# Patient Record
Sex: Female | Born: 1965 | Race: White | Hispanic: No | Marital: Married | Smoking: Never smoker
Health system: Southern US, Community
[De-identification: ages and names within clinical notes are randomized; demographics above are authoritative.]

## PROBLEM LIST (undated history)

## (undated) DIAGNOSIS — N1330 Unspecified hydronephrosis: Secondary | ICD-10-CM

## (undated) DIAGNOSIS — R109 Unspecified abdominal pain: Secondary | ICD-10-CM

## (undated) DIAGNOSIS — K219 Gastro-esophageal reflux disease without esophagitis: Secondary | ICD-10-CM

## (undated) DIAGNOSIS — E509 Vitamin A deficiency, unspecified: Secondary | ICD-10-CM

## (undated) DIAGNOSIS — Z6820 Body mass index (BMI) 20.0-20.9, adult: Secondary | ICD-10-CM

## (undated) DIAGNOSIS — F331 Major depressive disorder, recurrent, moderate: Secondary | ICD-10-CM

## (undated) DIAGNOSIS — F419 Anxiety disorder, unspecified: Secondary | ICD-10-CM

## (undated) DIAGNOSIS — I10 Essential (primary) hypertension: Secondary | ICD-10-CM

## (undated) HISTORY — DX: Essential (primary) hypertension: I10

## (undated) HISTORY — DX: Unspecified hydronephrosis: N13.30

## (undated) HISTORY — DX: Anxiety disorder, unspecified: F41.9

## (undated) HISTORY — PX: APPENDECTOMY: SHX54

## (undated) HISTORY — PX: HERNIA REPAIR: SHX51

## (undated) HISTORY — PX: KIDNEY STONE SURGERY: SHX686

## (undated) HISTORY — DX: Major depressive disorder, recurrent, moderate: F33.1

## (undated) HISTORY — DX: Gastro-esophageal reflux disease without esophagitis: K21.9

## (undated) HISTORY — DX: Unspecified abdominal pain: R10.9

## (undated) HISTORY — PX: CHOLECYSTECTOMY: SHX55

## (undated) HISTORY — DX: Body mass index (BMI) 20.0-20.9, adult: Z68.20

## (undated) HISTORY — DX: Vitamin a deficiency, unspecified: E50.9

---

## 2005-01-14 ENCOUNTER — Inpatient Hospital Stay (HOSPITAL_COMMUNITY): Admission: AD | Admit: 2005-01-14 | Discharge: 2005-01-14 | Payer: Self-pay | Admitting: Obstetrics and Gynecology

## 2005-01-15 ENCOUNTER — Inpatient Hospital Stay (HOSPITAL_COMMUNITY): Admission: AD | Admit: 2005-01-15 | Discharge: 2005-01-15 | Payer: Self-pay | Admitting: Obstetrics and Gynecology

## 2005-01-16 ENCOUNTER — Inpatient Hospital Stay (HOSPITAL_COMMUNITY): Admission: AD | Admit: 2005-01-16 | Discharge: 2005-01-16 | Payer: Self-pay | Admitting: Obstetrics and Gynecology

## 2005-01-23 ENCOUNTER — Inpatient Hospital Stay (HOSPITAL_COMMUNITY): Admission: AD | Admit: 2005-01-23 | Discharge: 2005-01-23 | Payer: Self-pay | Admitting: Obstetrics and Gynecology

## 2005-02-09 ENCOUNTER — Ambulatory Visit: Payer: Self-pay | Admitting: Family Medicine

## 2006-03-08 IMAGING — US US OB COMP LESS 14 WK
1 series · 14 of 28 positions shown · non-contrast
Comparison: none

CLINICAL DATA: Pregnant with pain, some bleeding.   

 OBSTETRICAL ULTRASOUND:
 There is in evidence of intrauterine gestational sac.   The endometrium is thickened and heterogeneous measuring 30.1 mm.  No yolk sac or embryo is visible.   Ovaries are normal in size with a small cyst noted on the right of 2.2 x 2.3 cm.

[Series 1: us ob comp less 14 wk · 0.31mm/px · 14 of 60 slices shown]
[im 3/60]
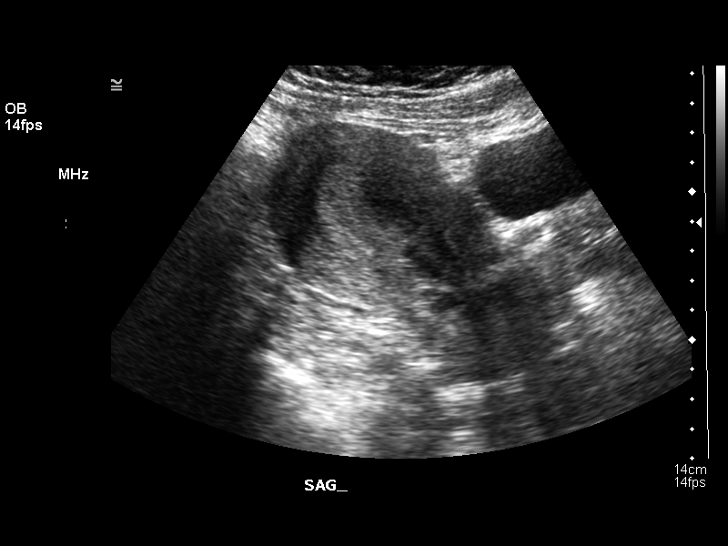
[im 7/60]
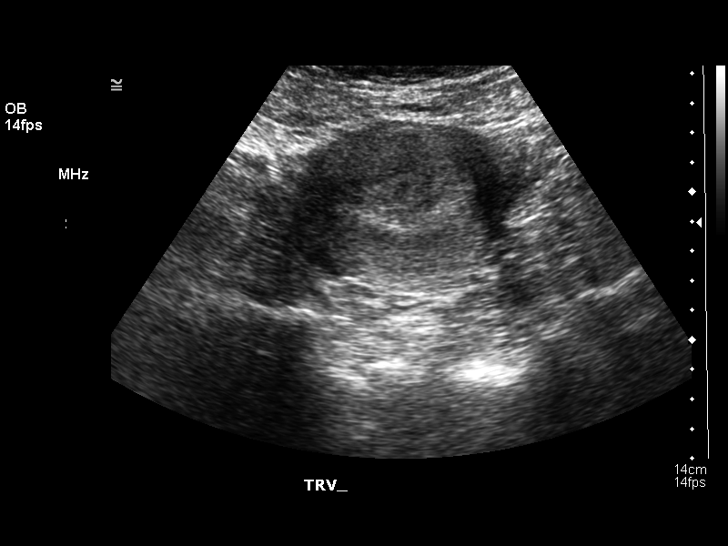
[im 11/60]
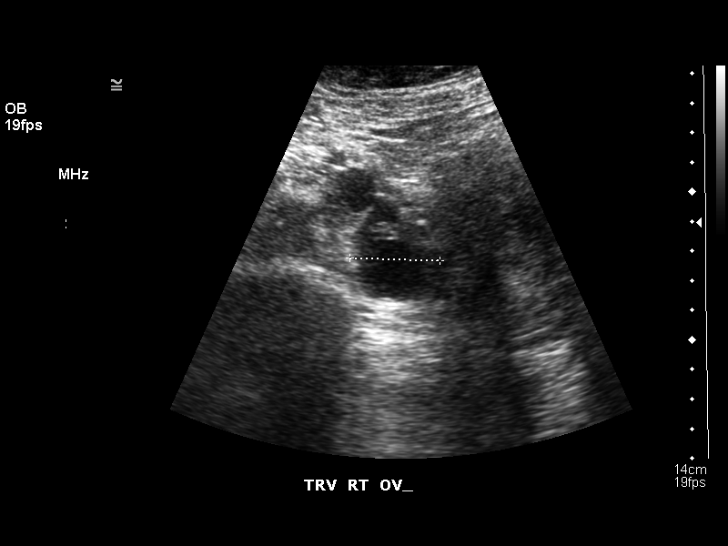
[im 16/60]
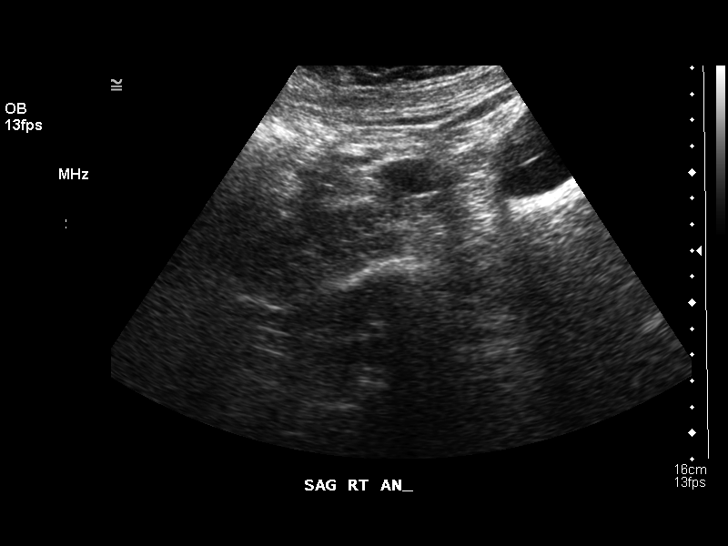
[im 20/60]
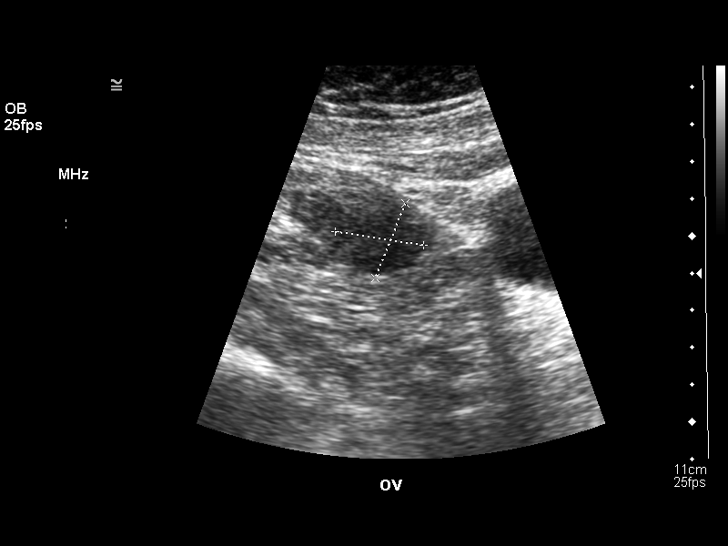
[im 25/60]
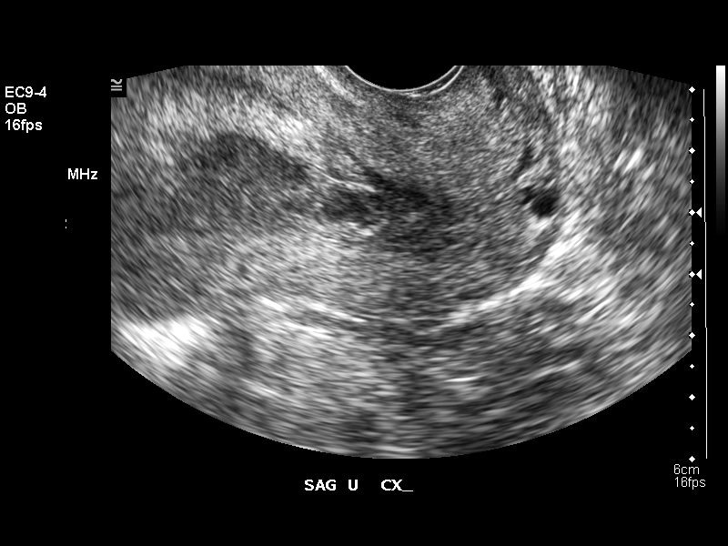
[im 29/60]
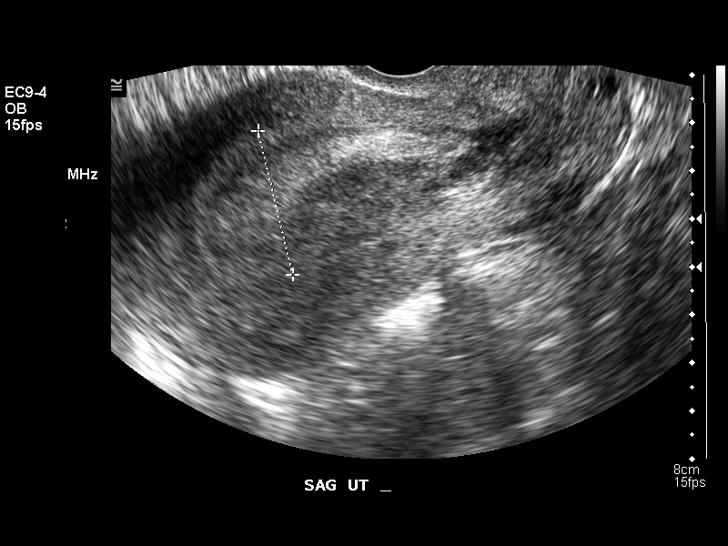
[im 33/60]
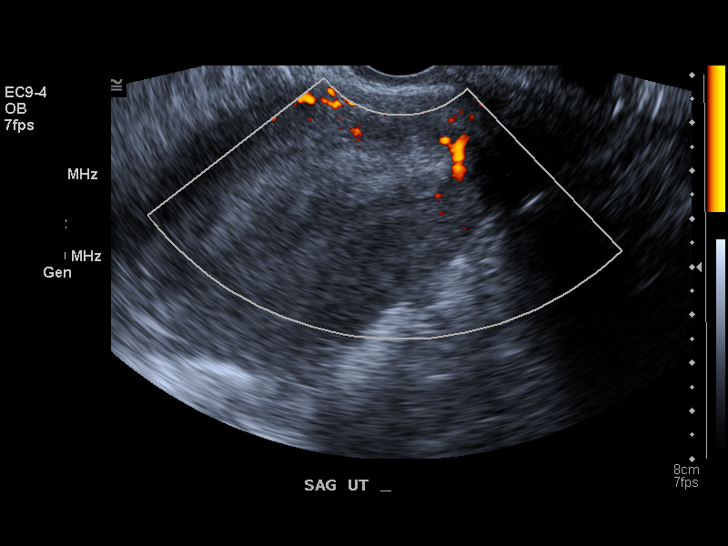
[im 38/60]
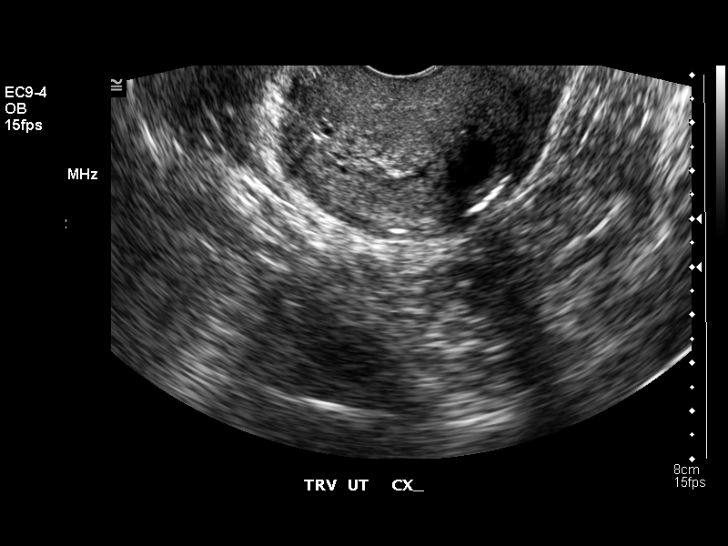
[im 42/60]
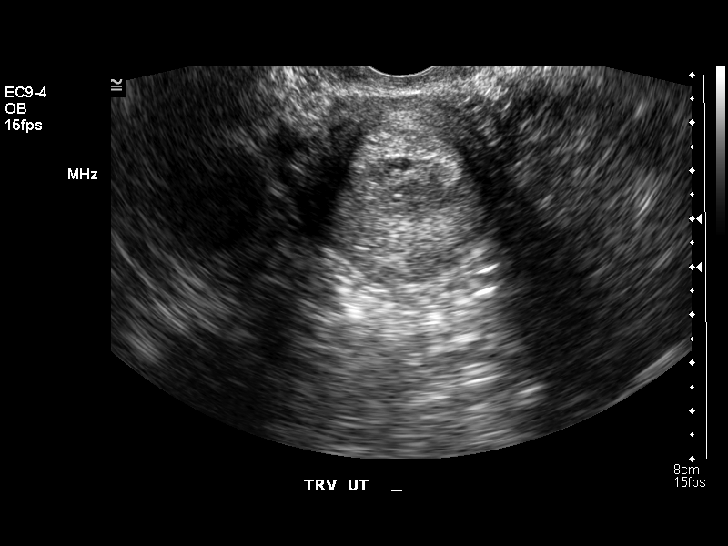
[im 46/60]
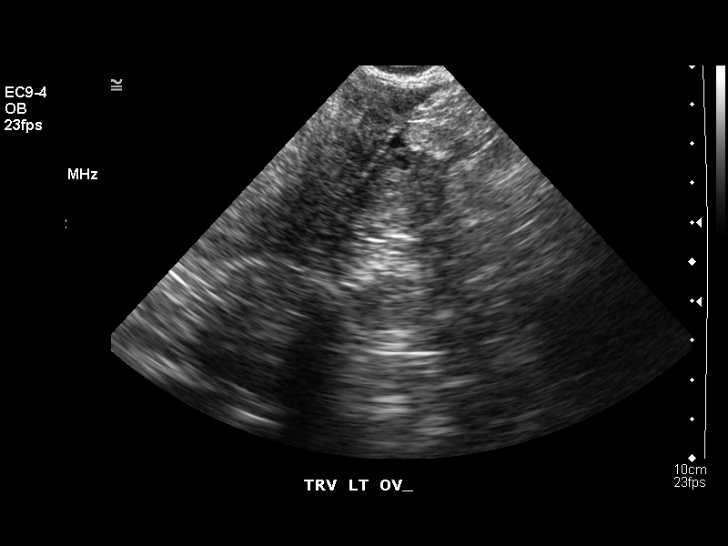
[im 51/60]
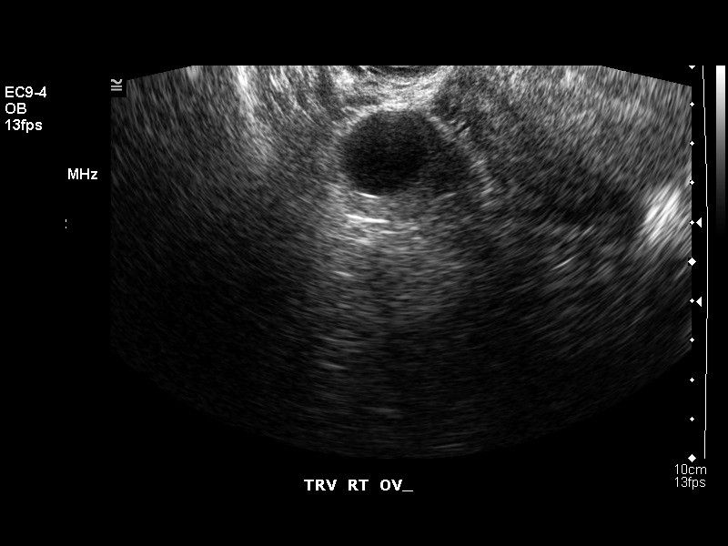
[im 55/60]
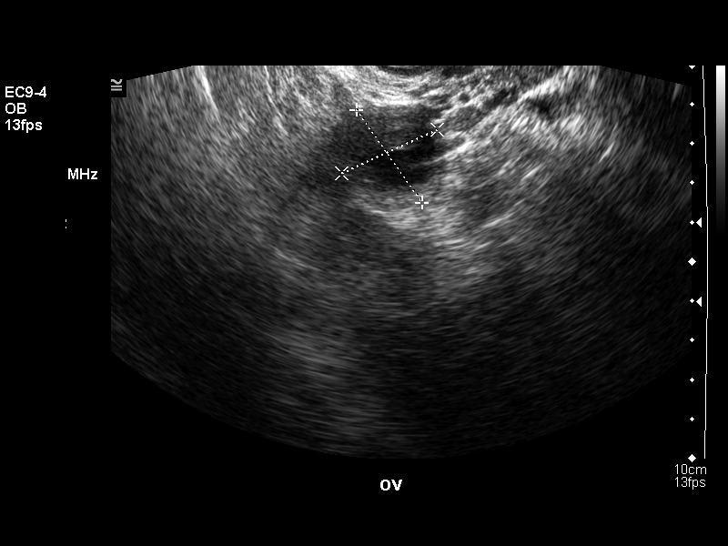
[im 60/60]
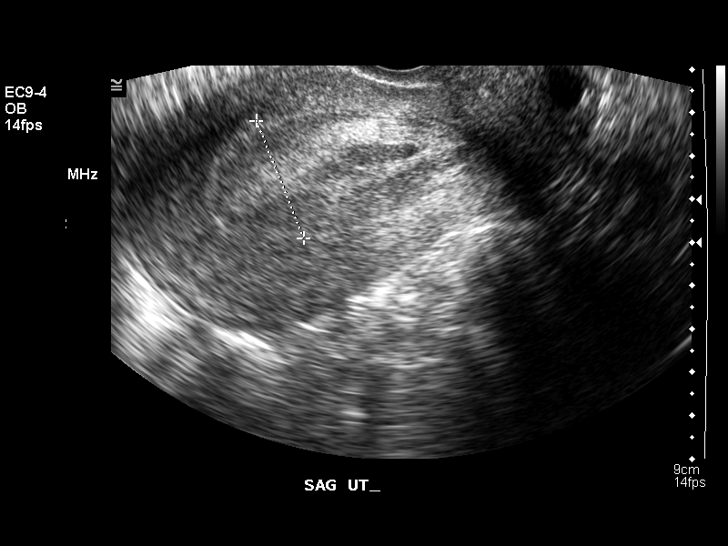

[14 of 28 positions shown; findings below may reference images not displayed]

IMPRESSION: No IUP.  Endometrium is thickened and heterogeneous measuring 30.8 mm.

## 2007-03-23 ENCOUNTER — Encounter: Admission: RE | Admit: 2007-03-23 | Discharge: 2007-03-23 | Payer: Self-pay | Admitting: Internal Medicine

## 2015-10-08 DIAGNOSIS — N200 Calculus of kidney: Secondary | ICD-10-CM | POA: Insufficient documentation

## 2015-10-17 DIAGNOSIS — Z9889 Other specified postprocedural states: Secondary | ICD-10-CM

## 2015-10-17 DIAGNOSIS — Z87442 Personal history of urinary calculi: Secondary | ICD-10-CM | POA: Insufficient documentation

## 2015-10-18 DIAGNOSIS — L253 Unspecified contact dermatitis due to other chemical products: Secondary | ICD-10-CM | POA: Insufficient documentation

## 2015-10-19 DIAGNOSIS — I10 Essential (primary) hypertension: Secondary | ICD-10-CM | POA: Insufficient documentation

## 2015-11-24 DIAGNOSIS — N135 Crossing vessel and stricture of ureter without hydronephrosis: Secondary | ICD-10-CM | POA: Insufficient documentation

## 2017-06-08 ENCOUNTER — Institutional Professional Consult (permissible substitution): Payer: Self-pay | Admitting: Pulmonary Disease

## 2021-04-01 ENCOUNTER — Encounter: Payer: Self-pay | Admitting: Gastroenterology

## 2021-04-15 ENCOUNTER — Ambulatory Visit (INDEPENDENT_AMBULATORY_CARE_PROVIDER_SITE_OTHER): Payer: Self-pay | Admitting: Gastroenterology

## 2021-04-15 ENCOUNTER — Other Ambulatory Visit: Payer: Self-pay

## 2021-04-15 VITALS — BP 107/70 | HR 76 | Ht 68.0 in | Wt 175.0 lb

## 2021-04-15 DIAGNOSIS — K5900 Constipation, unspecified: Secondary | ICD-10-CM

## 2021-04-15 DIAGNOSIS — R933 Abnormal findings on diagnostic imaging of other parts of digestive tract: Secondary | ICD-10-CM

## 2021-04-15 DIAGNOSIS — R1030 Lower abdominal pain, unspecified: Secondary | ICD-10-CM

## 2021-04-15 MED ORDER — CLENPIQ 10-3.5-12 MG-GM -GM/160ML PO SOLN: 1.0000 | ORAL | 0 refills | Status: DC

## 2021-04-15 NOTE — Patient Instructions (Signed)
If you are age 55 or older, your body mass index should be between 23-30. Your Body mass index is 26.61 kg/m. If this is out of the aforementioned range listed, please consider follow up with your Primary Care Provider.  If you are age 107 or younger, your body mass index should be between 19-25. Your Body mass index is 26.61 kg/m. If this is out of the aformentioned range listed, please consider follow up with your Primary Care Provider.   __________________________________________________________  The [facility] providers would like to encourage you to use [facility] to communicate with providers for non-urgent requests or questions.  Due to long hold times on the telephone, sending your provider a message by [facility] may be a faster and more efficient way to get a response.  Please allow 48 business hours for a response.  Please remember that this is for non-urgent requests.   __________________________________________________________ Dennis Bast have been given a packet of Sitz markers. Please follow the instructions and have an abdominal xray at [facility] on the 1st floor.  We will contact you once the xray has been read to advise you if we will proceed with the colononspopy.  We have sent the following medications to your pharmacy for you to pick up at your convenience:  Clenpiq- for your colonoscopy- do not pick this up until you have been directed about the colonoscopy  Due to recent changes in healthcare laws, you may see the results of your imaging and laboratory studies on MyChart before your provider has had a chance to review them.  We understand that in some cases there may be results that are confusing or concerning to you. Not all laboratory results come back in the same time frame and the provider may be waiting for multiple results in order to interpret others.  Please give [place] 48 hours in order for your provider to thoroughly review all the results before contacting the  office for clarification of your results.   Thank you for choosing me and [facility].  Vito Cirigliano, D.O.

## 2021-04-15 NOTE — Progress Notes (Signed)
Chief Complaint: Change in bowel habits, constipation   Referring Provider:     Renaldo Reel, PA   HPI:     Amber Cohen is a 55 y.o. female with history of HTN, depression, anxiety, nephrolithiasis s/p lithotripsy, cholecystectomy 2019, referred to the Gastroenterology Clinic for evaluation of alternating bowel habits.    History of constipation for the last 4 years or so, which she reports started shortly after her lithotripsy.  Does have episodic watery, nonbloody stools, but constipation is the main issue.  Does have some associated abdominal bloating and nausea when the constipation exacerbates.  Has trialed multiple agents/interventions, to include Miralax, Mag citrate, high fiber diet, increased water intake, prn enemas, occasional manual disimpaction.   More recently used Dulcolax with improvement, but diarrhea/urgency, so she stopped.  -03/25/2021: CT abdomen/pelvis at [facility]: 7 mm hepatic and angioma, otherwise normal liver and no duct dilation.  Small HH diffuse colonic wall thickening with mild adjacent inflammation extending from hepatic flexure to rectum, consistent with nonspecific infectious or inflammatory colitis. - 03/27/2021: Seen in follow-up by her PCM and started on Cipro/Flagyl (then Diflucan for yeast infection)   Currenlty, high fiber diet, hydration ; no longer using Miralax or other laxatives/stimulants.    Also with a "sore spot" on left flank for last couple years. Worse with exertion and BM.  Not present currently.  Reports having colonoscopy about 4 years ago by Dr. Lyda Jester which was normal per patient.  No report available for review.    Was seen in the ER on 03/21/2021 for evaluation of fatigue x1 month.  Did report constipation at that time as well, with periodic use of enema.  Evaluation notable for the following: - Normal CBC, UA, TSH - Mild hypomagnesemia (1.6) with otherwise normal BMP - Normal ECG - Treated with IV  fluids and IV magnesium with improvement and discharged home.  No imaging studies for review   Past Medical History:  Diagnosis Date   Acute right flank pain    Bilateral hydronephrosis    BMI 20.0-20.9, adult    Essential hypertension    Moderate episode of recurrent major depressive disorder (HCC)    Vitamin A deficiency      Past Surgical History:  Procedure Laterality Date   APPENDECTOMY     CHOLECYSTECTOMY     HERNIA REPAIR     KIDNEY STONE SURGERY     Family History  Problem Relation Age of Onset   Stroke Mother    Colon cancer Neg Hx      Current Outpatient Medications  Medication Sig Dispense Refill   amLODipine (NORVASC) 5 MG tablet Take 5 mg by mouth.     budesonide-formoterol (SYMBICORT) 160-4.5 MCG/ACT inhaler Inhale 2 puffs into the lungs 2 (two) times daily.     busPIRone (BUSPAR) 5 MG tablet TK 1 TO 2 TS PO BID FOR ANXIETY     FLUoxetine HCl 60 MG TABS      indapamide (LOZOL) 2.5 MG tablet Take 2.5 mg by mouth.     Lactobacillus Rhamnosus, GG, (RA PROBIOTIC DIGESTIVE CARE) CAPS Take 1 capsule by mouth daily.     levonorgestrel-ethinyl estradiol (NORDETTE) 0.15-30 MG-MCG tablet Take 0.15 mg by mouth.     mometasone (NASONEX) 50 MCG/ACT nasal spray      Multiple Vitamin (MULTI-VITAMINS) TABS Take by mouth.     Ondansetron 4 MG FILM Take by mouth.  potassium chloride (KLOR-CON) 20 MEQ packet Take by mouth 2 (two) times daily.     Potassium Citrate 15 MEQ (1620 MG) TBCR take 1 tablet by mouth twice a day     solifenacin (VESICARE) 10 MG tablet take 1 tablet by mouth once daily if needed for SPASM     No current facility-administered medications for this visit.   Allergies  Allergen Reactions   Ciprofloxacin Hives    Patient states she is not allergic to Cipro. Was given other medication during hospitalization.   Latex Rash    Skin Blister     Review of Systems: All systems reviewed and negative except where noted in HPI.     Physical Exam:     Wt Readings from Last 3 Encounters:  04/15/21 175 lb (79.4 kg)    BP 107/70   Pulse 76   Ht 5\' 8"  (1.727 m)   Wt 175 lb (79.4 kg)   SpO2 98%   BMI 26.61 kg/m  Constitutional:  Pleasant, in no acute distress. Psychiatric: Normal mood and affect. Behavior is normal. Cardiovascular: Normal rate, regular rhythm. No edema Pulmonary/chest: Effort normal and breath sounds normal. No wheezing, rales or rhonchi. Abdominal: Minimal TTP in lower abdomen without rebound or guarding.  No peritoneal signs.  Soft, nondistended, nontender. Bowel sounds active throughout. There are no masses palpable. No hepatomegaly. Neurological: Alert and oriented to person place and time. Skin: Skin is warm and dry. No rashes noted.   ASSESSMENT AND PLAN;   1) Constipation - Chronic constipation with episodic overflow diarrhea - Start with sitz marker study.  Symptoms otherwise seem less consistent with pelvic floor dyssynergia, so holding off on ARM for now (although the abrupt onset of symptoms is somewhat suspicious) - Do not restart laxatives, stool softeners, etc. until completion of sitz marker study - Continue adequate hydration - Continue high-fiber diet with fiber supplement as needed - Pending results above, briefly discussed starting Linzess, Amitiza, Motegrity, etc.  2) Colitis on CT - Discussed the role of colonoscopy to evaluate for mucosal healing and rule out luminal obstruction.  Scheduled to be done in late November, allowing 6-8 weeks of healing time  3) Lower abdominal pain - 2/2 the above constipation, but plan to rule out concomitant fecal/luminal pathology at time of colonoscopy as above, particular with colitis seen on CT  The indications, risks, and benefits of colonoscopy were explained to the patient in detail. Risks include but are not limited to bleeding, perforation, adverse reaction to medications, and cardiopulmonary compromise. Sequelae include but are not limited to the  possibility of surgery, hospitalization, and mortality. The patient verbalized understanding and wished to proceed. All questions answered, referred to the scheduler and bowel prep ordered. Further recommendations pending results of the exam.      Lavena Bullion, DO, FACG  04/15/2021, 8:52 AM   Renaldo Reel, PA

## 2021-04-19 ENCOUNTER — Ambulatory Visit (HOSPITAL_BASED_OUTPATIENT_CLINIC_OR_DEPARTMENT_OTHER)
Admission: RE | Admit: 2021-04-19 | Discharge: 2021-04-19 | Disposition: A | Discharge status: Home / Self Care | Payer: BC Managed Care – PPO | Source: Ambulatory Visit | Attending: Gastroenterology | Admitting: Gastroenterology

## 2021-04-19 ENCOUNTER — Other Ambulatory Visit: Payer: Self-pay

## 2021-04-19 DIAGNOSIS — K5900 Constipation, unspecified: Secondary | ICD-10-CM | POA: Insufficient documentation

## 2021-04-19 DIAGNOSIS — R1030 Lower abdominal pain, unspecified: Secondary | ICD-10-CM | POA: Insufficient documentation

## 2021-04-22 ENCOUNTER — Telehealth: Payer: Self-pay | Admitting: General Surgery

## 2021-04-22 NOTE — Telephone Encounter (Signed)
-----   Message from Lavena Bullion, DO sent at 04/22/2021 12:48 PM EDT ----- Abdominal x-ray reviewed.  1 radiopaque marker in the LUQ and an additional 8 markers in the pelvis.  While this is not a technically positive study based on the number of retained markers (9), the fact that the majority are retained within the pelvis is suggestive of distal pathology such as pelvic floor dyssynergia.  Aside from large colonic stool burden, no other intra-abdominal pathology noted on imaging study.  Plan for the following: - Proceed with colonoscopy as scheduled for further evaluation along with evaluation of colitis seen on previous CT -Anorectal Manometry to be done after completion of colonoscopy -Can start trial of Linzess 72 mcg/day

## 2021-04-22 NOTE — Telephone Encounter (Signed)
Contacted the patient and discussed the results to her xray. The patient verbalized understanding and will have her scheduled colonoscopy. Samples of Linzess were offered to the patient she will pick up 4 bottles 39mcg . Patient advised to take 72 mcg daily. She will contact the office after she has taken them to advise if they are working.  Linzess 72 mcg samples 4 bottles- 4 pills in each Lot #[id number] exp 05/2022

## 2021-05-21 ENCOUNTER — Other Ambulatory Visit: Payer: Self-pay | Admitting: General Surgery

## 2021-05-21 ENCOUNTER — Telehealth: Payer: Self-pay | Admitting: General Surgery

## 2021-05-21 NOTE — Telephone Encounter (Signed)
The patient contacted the office and stated that for some reason her colonoscopy date was moved to Dec 8 instead of being on 05/26/2021 at 11:00. I was unable to figure out the reason for this. Moved her appointment back to 11/23 at 1230 the patient was already off work for that day. Patient was understanding and happy by the end of our conversation. Patients father came to clinic to pick up new instructions.

## 2021-05-25 ENCOUNTER — Telehealth: Payer: Self-pay | Admitting: General Surgery

## 2021-05-25 NOTE — Telephone Encounter (Signed)
Patient moved to 12:00pm detailed message left.

## 2021-05-25 NOTE — Telephone Encounter (Signed)
LVM for the patient to move her [place] appointment up for 1122/2022 from 1230 to 9am

## 2021-05-25 NOTE — Telephone Encounter (Signed)
Tried to contact the patients husband,no voicemail set up

## 2021-05-26 ENCOUNTER — Encounter: Payer: Self-pay | Admitting: Gastroenterology

## 2021-05-26 ENCOUNTER — Encounter: Payer: BC Managed Care – PPO | Admitting: Gastroenterology

## 2021-05-26 ENCOUNTER — Ambulatory Visit (AMBULATORY_SURGERY_CENTER): Payer: BC Managed Care – PPO | Admitting: Gastroenterology

## 2021-05-26 ENCOUNTER — Other Ambulatory Visit: Payer: Self-pay | Admitting: Gastroenterology

## 2021-05-26 VITALS — BP 150/77 | HR 65 | Temp 98.6°F | Resp 17 | Ht 68.0 in | Wt 175.0 lb

## 2021-05-26 DIAGNOSIS — K640 First degree hemorrhoids: Secondary | ICD-10-CM | POA: Diagnosis not present

## 2021-05-26 DIAGNOSIS — K5730 Diverticulosis of large intestine without perforation or abscess without bleeding: Secondary | ICD-10-CM

## 2021-05-26 DIAGNOSIS — R933 Abnormal findings on diagnostic imaging of other parts of digestive tract: Secondary | ICD-10-CM | POA: Diagnosis not present

## 2021-05-26 DIAGNOSIS — K635 Polyp of colon: Secondary | ICD-10-CM

## 2021-05-26 DIAGNOSIS — K5900 Constipation, unspecified: Secondary | ICD-10-CM | POA: Diagnosis not present

## 2021-05-26 DIAGNOSIS — R1030 Lower abdominal pain, unspecified: Secondary | ICD-10-CM | POA: Diagnosis not present

## 2021-05-26 DIAGNOSIS — D123 Benign neoplasm of transverse colon: Secondary | ICD-10-CM

## 2021-05-26 MED ORDER — SODIUM CHLORIDE 0.9 % IV SOLN: 500.0000 mL | Freq: Once | INTRAVENOUS | Status: DC

## 2021-05-26 NOTE — Progress Notes (Signed)
DT vitals and  EW IV.

## 2021-05-26 NOTE — Progress Notes (Signed)
GASTROENTEROLOGY PROCEDURE H&P NOTE   Primary Care Physician: Pcp, No    Reason for Procedure:  Change in bowel habits, constipation with episodic diarrhea, abdominal bloating, abnormal CT  Plan:    Colonoscopy  Patient is appropriate for endoscopic procedure(s) in the ambulatory ([place]) setting.  The nature of the procedure, as well as the risks, benefits, and alternatives were carefully and thoroughly reviewed with the patient. Ample time for discussion and questions allowed. The patient understood, was satisfied, and agreed to proceed.     HPI: Amber Cohen is a 55 y.o. female who presents for colonoscopy for evaluation of change in bowel habits with predominant constipation and episodic watery, nonbloody stools.  CT in 03/2021 with diffuse colonic wall thickening with mild adjacent inflammation extending from hepatic flexure to rectum, consistent with nonspecific infectious or inflammatory colitis.  Was treated with course of Cipro/Flagyl by PCM.  Subsequent sitz marker study on 04/19/2021 which was notable for 9 retained markers, with 8 in the pelvis.  While this is not a positive study, it does lend some suggestion of delayed outlet and was referred for ARM and started on Linzess.  She presents today for colonoscopy.  Past Medical History:  Diagnosis Date   Acute right flank pain    Anxiety    Bilateral hydronephrosis    BMI 20.0-20.9, adult    Essential hypertension    GERD (gastroesophageal reflux disease)    Moderate episode of recurrent major depressive disorder (HCC)    Vitamin A deficiency     Past Surgical History:  Procedure Laterality Date   APPENDECTOMY     CHOLECYSTECTOMY     HERNIA REPAIR     KIDNEY STONE SURGERY      Prior to Admission medications   Medication Sig Start Date End Date Taking? Authorizing Provider  budesonide-formoterol (SYMBICORT) 160-4.5 MCG/ACT inhaler Inhale 2 puffs into the lungs 2 (two) times daily.   Yes [provider]  busPIRone (BUSPAR) 5 MG tablet TK 1 TO 2 TS PO BID FOR ANXIETY 05/28/18  Yes [provider]  FLUoxetine HCl 60 MG TABS  12/31/15  Yes [provider]  Multiple Vitamin (MULTI-VITAMINS) TABS Take by mouth.   Yes [provider]  Potassium Citrate 15 MEQ (1620 MG) TBCR take 1 tablet by mouth twice a day 03/09/16  Yes [provider]  Lactobacillus Rhamnosus, GG, (RA PROBIOTIC DIGESTIVE CARE) CAPS Take 1 capsule by mouth daily.    [provider]  levonorgestrel-ethinyl estradiol (NORDETTE) 0.15-30 MG-MCG tablet Take 0.15 mg by mouth. 10/07/15   [provider]  solifenacin (VESICARE) 10 MG tablet take 1 tablet by mouth once daily if needed for SPASM 11/06/15   [provider]    Current Outpatient Medications  Medication Sig Dispense Refill   budesonide-formoterol (SYMBICORT) 160-4.5 MCG/ACT inhaler Inhale 2 puffs into the lungs 2 (two) times daily.     busPIRone (BUSPAR) 5 MG tablet TK 1 TO 2 TS PO BID FOR ANXIETY     FLUoxetine HCl 60 MG TABS      Multiple Vitamin (MULTI-VITAMINS) TABS Take by mouth.     Potassium Citrate 15 MEQ (1620 MG) TBCR take 1 tablet by mouth twice a day     Lactobacillus Rhamnosus, GG, (RA PROBIOTIC DIGESTIVE CARE) CAPS Take 1 capsule by mouth daily.     levonorgestrel-ethinyl estradiol (NORDETTE) 0.15-30 MG-MCG tablet Take 0.15 mg by mouth.     solifenacin (VESICARE) 10 MG tablet take 1 tablet by  mouth once daily if needed for SPASM     Current Facility-Administered Medications  Medication Dose Route Frequency Provider Last Rate Last Admin   0.9 %  sodium chloride infusion  500 mL Intravenous Once Haadiya Frogge V, DO        Allergies as of 05/26/2021 - Review Complete 05/26/2021  Allergen Reaction Noted   Latex Rash 10/08/2015    Family History  Problem Relation Age of Onset   Stroke Mother    Colon cancer Neg Hx    Rectal cancer Neg Hx    Stomach cancer Neg Hx    Esophageal cancer Neg Hx      Social History   Socioeconomic History   Marital status: Married    Spouse name: Not on file   Number of children: 2   Years of education: Not on file   Highest education level: Not on file  Occupational History   Occupation: Chartered loss adjuster  Tobacco Use   Smoking status: Never   Smokeless tobacco: Never  Vaping Use   Vaping Use: Never used  Substance and Sexual Activity   Alcohol use: Never   Drug use: Never   Sexual activity: Not on file  Other Topics Concern   Not on file  Social History Narrative   Not on file   Social Determinants of Health   Financial Resource Strain: Not on file  Food Insecurity: Not on file  Transportation Needs: Not on file  Physical Activity: Not on file  Stress: Not on file  Social Connections: Not on file  Intimate Partner Violence: Not on file    Physical Exam: Vital signs in last 24 hours: @BP  138/79   Pulse 70   Temp 98.6 F (37 C)   Ht 5\' 8"  (1.727 m)   Wt 175 lb (79.4 kg)   BMI 26.61 kg/m  GEN: NAD EYE: Sclerae anicteric ENT: MMM CV: Non-tachycardic Pulm: CTA b/l GI: Soft, NT/ND NEURO:  Alert & Oriented x 3   Gerrit Heck, DO [facility]   05/26/2021 9:03 AM

## 2021-05-26 NOTE — Progress Notes (Signed)
To PACU, VSS. Report to [email or web address] 

## 2021-05-26 NOTE — Op Note (Signed)
[facility] Patient Name: Amber Cohen Procedure Date: 05/26/2021 9:01 AM MRN: [id number] Endoscopist: Gerrit Heck , MD Age: 55 Referring MD:  Date of Birth: May 21, 1966 Gender: Female Account #: [id number] Procedure:                Colonoscopy Indications:              Abnormal CT of the GI tract, Change in bowel                            habits, Constipation                           CT in 03/2021 with diffuse colonic wall thickening                            with mild adjacent inflammation extending from                            hepatic flexure to rectum, consistent with                            nonspecific infectious or inflammatory colitis. Was                            treated with course of Cipro/Flagyl by PCM.                            Subsequent sitz marker study on 04/19/2021 which                            was notable for 9 retained markers, with 8 in the                            pelvis. Started on Linzess and presents today for                            colonsocopy. Medicines:                Monitored Anesthesia Care Procedure:                Pre-Anesthesia Assessment:                           - Prior to the procedure, a History and Physical                            was performed, and patient medications and                            allergies were reviewed. The patient's tolerance of                            previous anesthesia was also reviewed. The risks  and benefits of the procedure and the sedation                            options and risks were discussed with the patient.                            All questions were answered, and informed consent                            was obtained. Prior Anticoagulants: The patient has                            taken no previous anticoagulant or antiplatelet                            agents. ASA Grade Assessment: II - A patient with                            mild  systemic disease. After reviewing the risks                            and benefits, the patient was deemed in                            satisfactory condition to undergo the procedure.                           After obtaining informed consent, the colonoscope                            was passed under direct vision. Throughout the                            procedure, the patient's blood pressure, pulse, and                            oxygen saturations were monitored continuously. The                            Olympus CF-HQ190L ([id number]) Colonoscope was                            introduced through the anus and advanced to the the                            terminal ileum. The colonoscopy was performed                            without difficulty. The patient tolerated the                            procedure well. The quality of the bowel  preparation was fair, but overall adequate for the                            main purposes of this study. The terminal ileum,                            ileocecal valve, appendiceal orifice, and rectum                            were photographed. Scope In: 9:16:16 AM Scope Out: 9:34:33 AM Scope Withdrawal Time: 0 hours 13 minutes 30 seconds  Total Procedure Duration: 0 hours 18 minutes 17 seconds  Findings:                 The perianal and digital rectal examinations were                            normal.                           A 8 mm polyp was found in the transverse colon. The                            polyp was sessile. The polyp was removed with a                            cold snare. Resection and retrieval were complete.                            Estimated blood loss was minimal.                           Multiple small and large-mouthed diverticula were                            found in the sigmoid colon and transverse colon.                           A moderate amount of fiercely adeherent stool was                             found in the ascending colon and in the cecum,                            interfering with visualization. Lavage of the area                            was performed using copious amounts of tap water,                            resulting in clearance with fair visualization.                            Cannot rule out the presence of small or flat  polyps in these areas. Otherwise, the visualized                            mucosa was normal appearing without any areas of                            mucosal erythema, edema, erosions, or ulceration.                            No areas of luminal narrowing, stricture, or                            obstruction.                           Non-bleeding internal hemorrhoids were found during                            retroflexion. The hemorrhoids were small.                           The terminal ileum appeared normal. Complications:            No immediate complications. Estimated Blood Loss:     Estimated blood loss was minimal. Impression:               - There was fiercely aherent stools/mucus debris in                            the right colon. Visualization was improved with                            copious irrigation, but still some areas of                            incomplete visualization. Cannot rule out the                            presence of small or flat polyps in these areas.                           - One 8 mm polyp in the transverse colon, removed                            with a cold snare. Resected and retrieved.                           - Diverticulosis in the sigmoid colon and in the                            transverse colon.                           - Stool in the ascending colon and in the cecum.                           -  Non-bleeding internal hemorrhoids.                           - The examined portion of the ileum was normal. Recommendation:           -  Patient has a contact number available for                            emergencies. The signs and symptoms of potential                            delayed complications were discussed with the                            patient. Return to normal activities tomorrow.                            Written discharge instructions were provided to the                            patient.                           - Resume previous diet.                           - Continue present medications.                           - Await pathology results.                           - Repeat colonoscopy in 2-3 years because the bowel                            preparation was suboptimal and for surveillance.                           - Return to GI office at appointment to be                            scheduled. Gerrit Heck, MD 05/26/2021 9:43:48 AM

## 2021-05-26 NOTE — Progress Notes (Signed)
Called to room to assist during endoscopic procedure.  Patient ID and intended procedure confirmed with present staff. Received instructions for my participation in the procedure from the performing physician.  

## 2021-05-26 NOTE — Patient Instructions (Addendum)
Handouts on polys and diverticulosis given to you today    YOU HAD AN ENDOSCOPIC PROCEDURE TODAY AT [facility]:   Refer to the procedure report that was given to you for any specific questions about what was found during the examination.  If the procedure report does not answer your questions, please call your gastroenterologist to clarify.  If you requested that your care partner not be given the details of your procedure findings, then the procedure report has been included in a sealed envelope for you to review at your convenience later.  YOU SHOULD EXPECT: Some feelings of bloating in the abdomen. Passage of more gas than usual.  Walking can help get rid of the air that was put into your GI tract during the procedure and reduce the bloating. If you had a lower endoscopy (such as a colonoscopy or flexible sigmoidoscopy) you may notice spotting of blood in your stool or on the toilet paper. If you underwent a bowel prep for your procedure, you may not have a normal bowel movement for a few days.  Please Note:  You might notice some irritation and congestion in your nose or some drainage.  This is from the oxygen used during your procedure.  There is no need for concern and it should clear up in a day or so.  SYMPTOMS TO REPORT IMMEDIATELY:  Following lower endoscopy (colonoscopy or flexible sigmoidoscopy):  Excessive amounts of blood in the stool  Significant tenderness or worsening of abdominal pains  Swelling of the abdomen that is new, acute  Fever of 100F or higher  For urgent or emergent issues, a gastroenterologist can be reached at any hour by calling [phone]. Do not use MyChart messaging for urgent concerns.    DIET:  We do recommend a small meal at first, but then you may proceed to your regular diet.  Drink plenty of fluids but you should avoid alcoholic beverages for 24 hours.  ACTIVITY:  You should plan to take it easy for the rest of today and you  should NOT DRIVE or use heavy machinery until tomorrow (because of the sedation medicines used during the test).    FOLLOW UP: Our staff will call the number listed on your records 48-72 hours following your procedure to check on you and address any questions or concerns that you may have regarding the information given to you following your procedure. If we do not reach you, we will leave a message.  We will attempt to reach you two times.  During this call, we will ask if you have developed any symptoms of COVID 19. If you develop any symptoms (ie: fever, flu-like symptoms, shortness of breath, cough etc.) before then, please call [phone].  If you test positive for Covid 19 in the 2 weeks post procedure, please call and report this information to [place].    If any biopsies were taken you will be contacted by phone or by letter within the next 1-3 weeks.  Please call us at [phone] if you have not heard about the biopsies in 3 weeks.    SIGNATURES/CONFIDENTIALITY: You and/or your care partner have signed paperwork which will be entered into your electronic medical record.  These signatures attest to the fact that that the information above on your After Visit Summary has been reviewed and is understood.  Full responsibility of the confidentiality of this discharge information lies with you and/or your care-partner.

## 2021-06-01 ENCOUNTER — Telehealth: Payer: Self-pay

## 2021-06-01 NOTE — Telephone Encounter (Signed)
Second attempt follow up call to pt, no answer.  

## 2021-06-01 NOTE — Telephone Encounter (Signed)
Per 05/24/21 procedure report - Return to GI office at appt to be scheduled  Patient has been scheduled for a follow up with Dr. Bryan Lemma on Wednesday, 07/07/21 at 11 am. Will send appt information via my chart and will place a copy in the mail.

## 2021-06-01 NOTE — Telephone Encounter (Signed)
Left message on follow up call. 

## 2021-06-09 ENCOUNTER — Encounter: Payer: BC Managed Care – PPO | Admitting: Gastroenterology

## 2021-06-09 ENCOUNTER — Encounter: Payer: Self-pay | Admitting: Gastroenterology

## 2021-07-07 ENCOUNTER — Ambulatory Visit: Payer: BC Managed Care – PPO | Admitting: Gastroenterology

## 2021-12-20 ENCOUNTER — Telehealth: Payer: Self-pay | Admitting: Gastroenterology

## 2021-12-20 ENCOUNTER — Other Ambulatory Visit: Payer: Self-pay

## 2021-12-20 DIAGNOSIS — R197 Diarrhea, unspecified: Secondary | ICD-10-CM

## 2021-12-20 DIAGNOSIS — R1030 Lower abdominal pain, unspecified: Secondary | ICD-10-CM

## 2021-12-20 NOTE — Telephone Encounter (Signed)
Pt stated that she caught a cold a few weeks ago and eventually went to urgent care and was prescribed Doxycycline and steroids. Pt states she took 5 days worth of antibiotics and stopped because she felt like she was getting worse. Pt stopped antibiotic on Friday. Pt states that urgent diarrhea started after she began antibiotics and has left sided abd pain. Pt also states that along with the heartburn, diarrhea and abd pain she has had a lot of bloating and feeling full without eating much. Pt said she just took pepto bismol but wanted to know what she could possibly take to help with symptoms to get her through until appt.

## 2021-12-20 NOTE — Telephone Encounter (Signed)
Plan for the following:  - Check GI PCR panel - Check C. difficile - Can start probiotic while awaiting stool studies - Maintain adequate hydration

## 2021-12-20 NOTE — Telephone Encounter (Signed)
Inbound call from patient stating she is having issues with GERD, heartburn, diarrhea and abd pain. Patient was scheduled with Alonza Bogus at 7/12 at 9:00. Patient is requesting a call back to discuss if there is something she can do to help her in the meantime. Please advise.

## 2021-12-20 NOTE — Telephone Encounter (Signed)
Left detailed voicemail on pt's mobile number. Orders placed for stool studies.

## 2022-01-12 ENCOUNTER — Ambulatory Visit: Payer: BC Managed Care – PPO | Admitting: Gastroenterology

## 2022-06-10 IMAGING — DX DG ABDOMEN 2V
2 series · 2 of 2 positions shown · non-contrast
Comparison: CT abdomen and pelvis 03/25/2021.

CLINICAL DATA: Constipation.  Abdominal pain.

EXAM:
ABDOMEN - 2 VIEW

[abdomen erect]
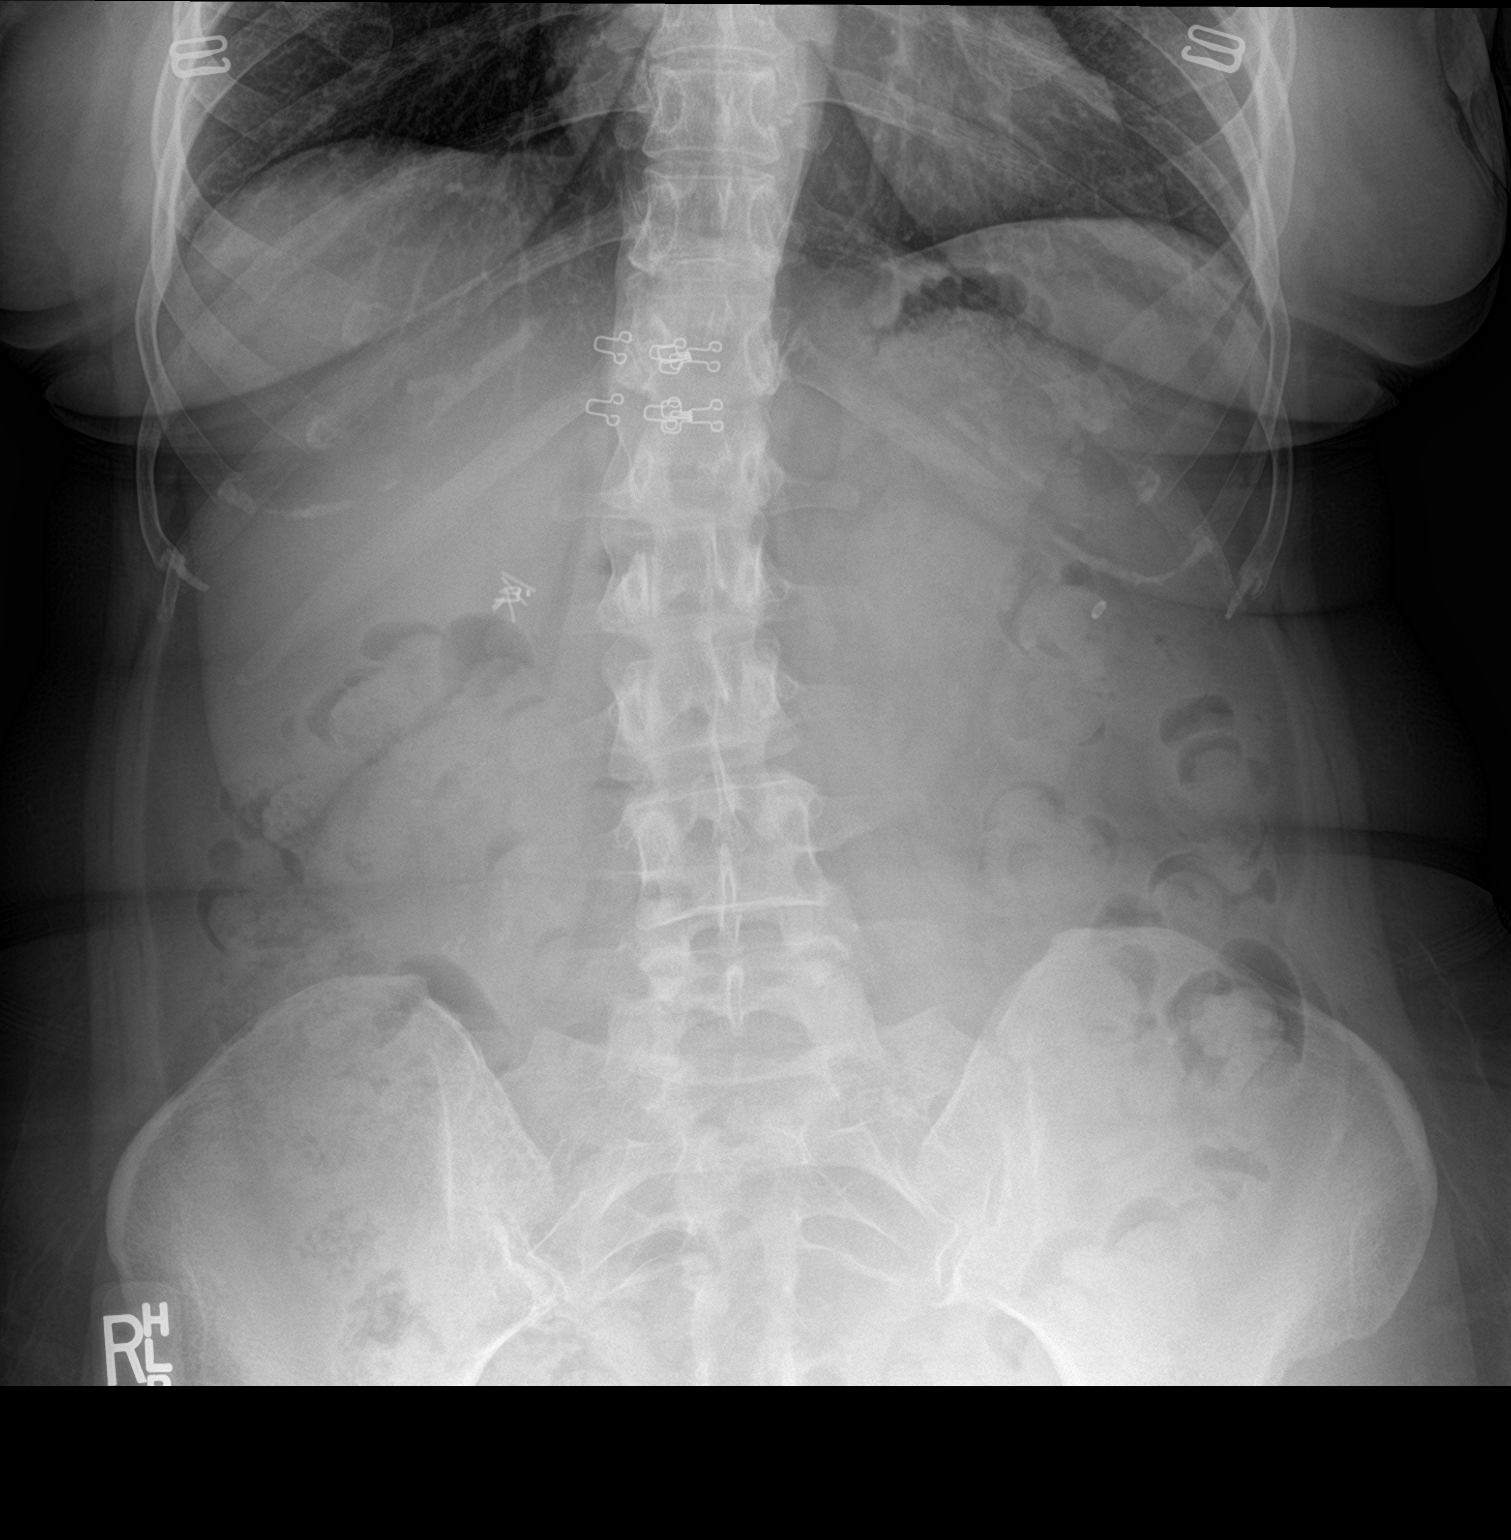

[abdomen supine]
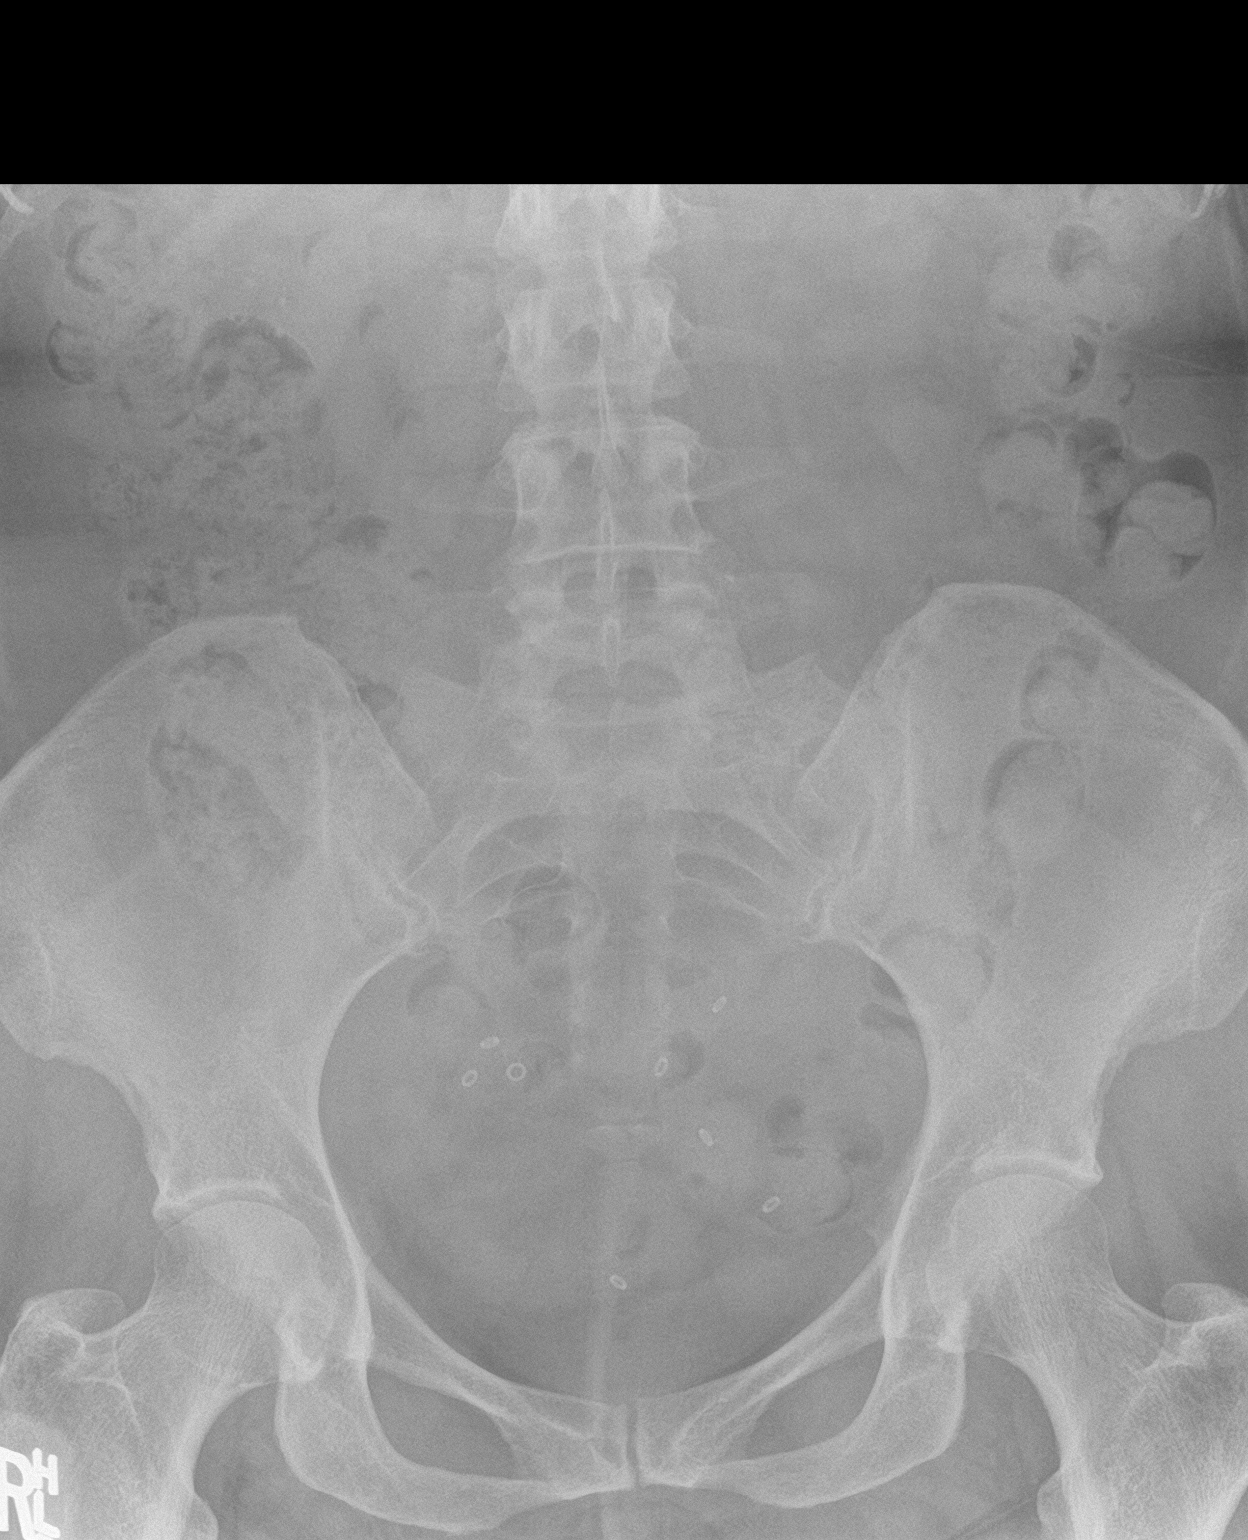

[2 of 2 positions shown; findings below may reference images not displayed]

FINDINGS: The bowel gas pattern is normal. There is no evidence of free air.
There are surgical clips in the right upper quadrant. Rounded
radiopaque densities are seen in the region of the pelvis measuring
4 mm in diameter. A single rounded radiopaque density is seen in the
left upper quadrant in the region of the splenic flexure of the
colon. There is a large amount of stool throughout the colon. Lung
bases are clear. Osseous structures are within normal limits.
IMPRESSION: 1. Nonobstructive bowel gas pattern.
2. Small rounded radiopaque densities in the pelvis in left upper
quadrant may represent foreign bodies in the colon. Please correlate
clinically.
